# Patient Record
Sex: Male | Born: 1990
Health system: Southern US, Community
[De-identification: ages and names within clinical notes are randomized; demographics above are authoritative.]

## PROBLEM LIST (undated history)

## (undated) DIAGNOSIS — F32A Depression, unspecified: Secondary | ICD-10-CM

## (undated) DIAGNOSIS — F419 Anxiety disorder, unspecified: Secondary | ICD-10-CM

---

## 2012-05-30 ENCOUNTER — Other Ambulatory Visit: Payer: Self-pay | Admitting: Family Medicine

## 2012-05-30 DIAGNOSIS — R079 Chest pain, unspecified: Secondary | ICD-10-CM

## 2012-06-03 ENCOUNTER — Ambulatory Visit
Admission: RE | Admit: 2012-06-03 | Discharge: 2012-06-03 | Disposition: A | Discharge status: Home / Self Care | Payer: PRIVATE HEALTH INSURANCE | Source: Ambulatory Visit | Attending: Family Medicine | Admitting: Family Medicine

## 2012-06-03 DIAGNOSIS — R079 Chest pain, unspecified: Secondary | ICD-10-CM

## 2013-06-02 IMAGING — CT CT CHEST W/O CM
3 of 4 series · 17 of 30 positions shown, 19 images · non-contrast
Comparison: None.

CLINICAL DATA: Chest pain.

CT CHEST WITHOUT CONTRAST
TECHNIQUE: Multidetector CT imaging of the chest was performed
following the standard protocol without IV contrast.

[Series 3: chest w/o · axial · non-contrast · 0.70mm/px · z∈[-288,-98]mm · 4 of 64 slices shown]
[im 13/64  lung]
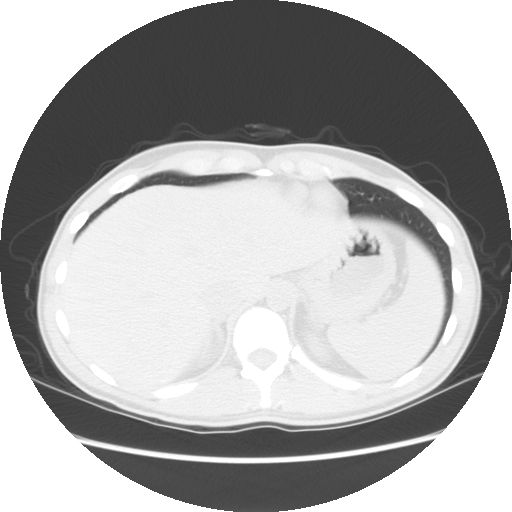
[im 26/64  lung]
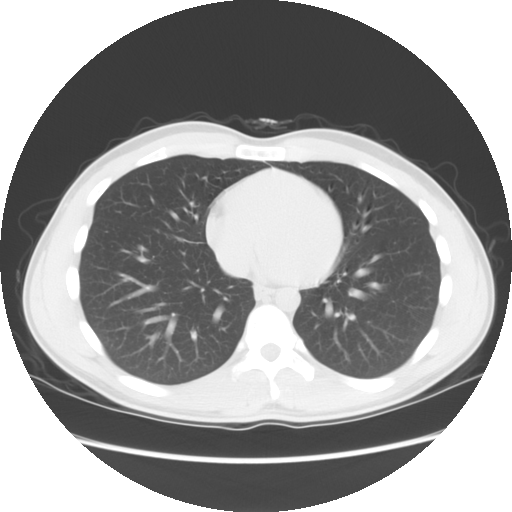
[im 38/64  lung]
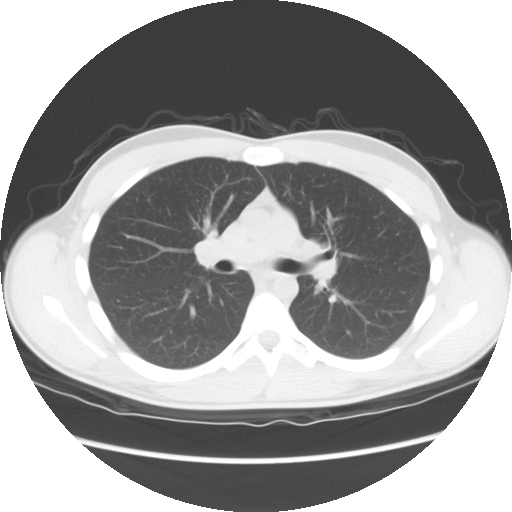
[im 51/64  lung]
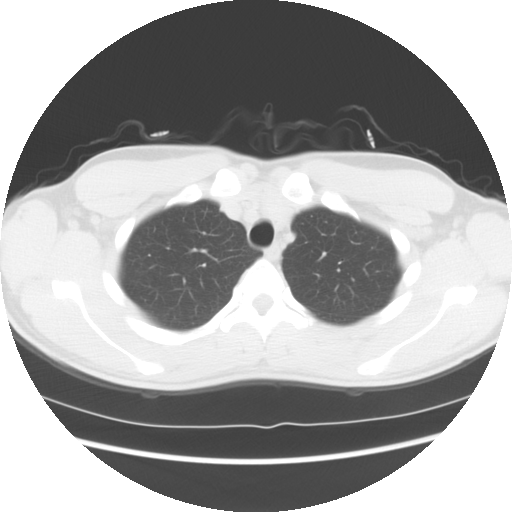

[Series 4: lung windows · axial · 0.70mm/px · z∈[-298,-88]mm · 5 of 64 slices shown, 7 images]
[im 11/64  mediastinal]
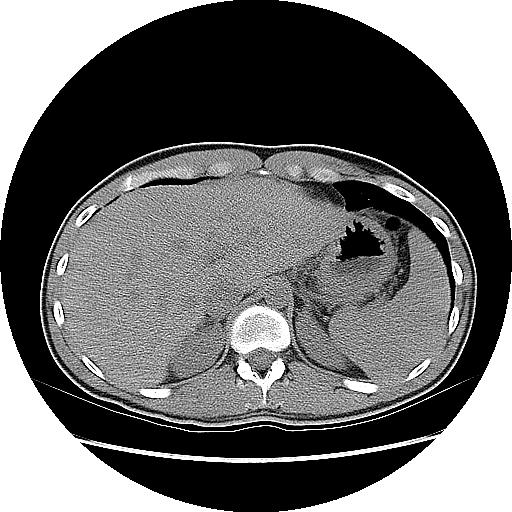
[im 11/64  lung]
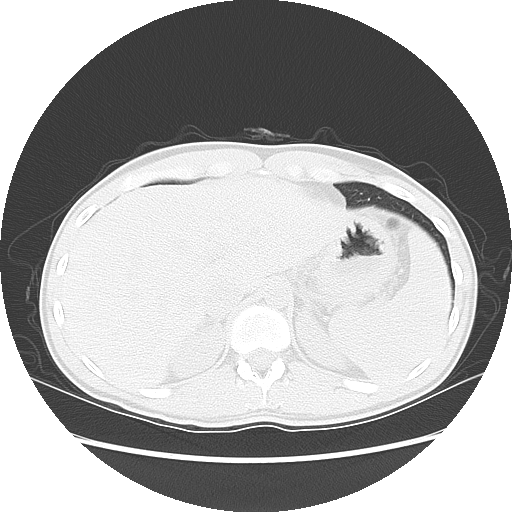
[im 22/64  lung]
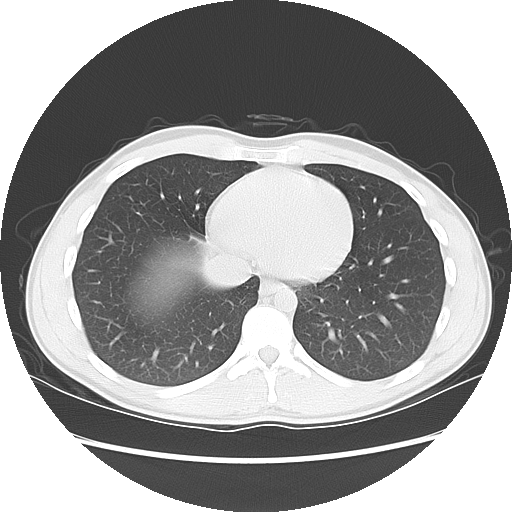
[im 32/64  lung]
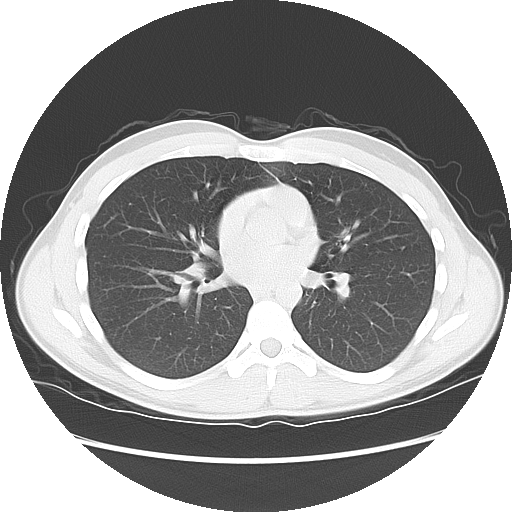
[im 43/64  lung]
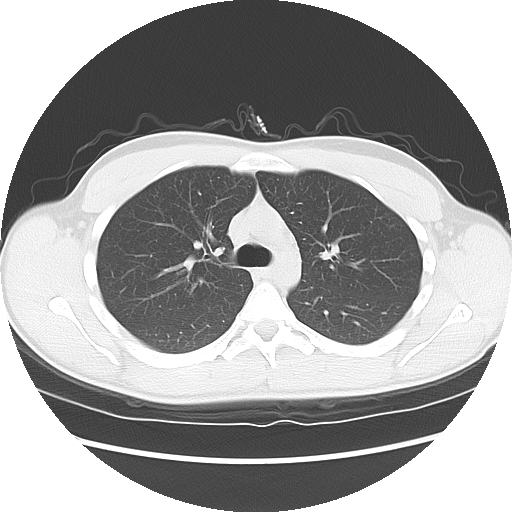
[im 53/64  mediastinal]
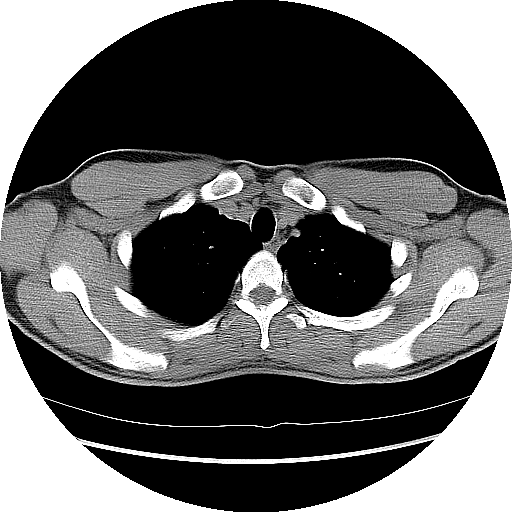
[im 53/64  lung]
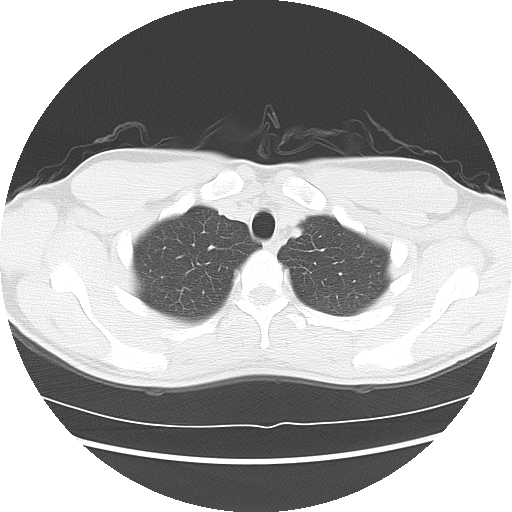

[Series 602: sagittal body · sagittal · 0.70mm/px · 8 of 145 slices shown]
[im 11/145  mediastinal]
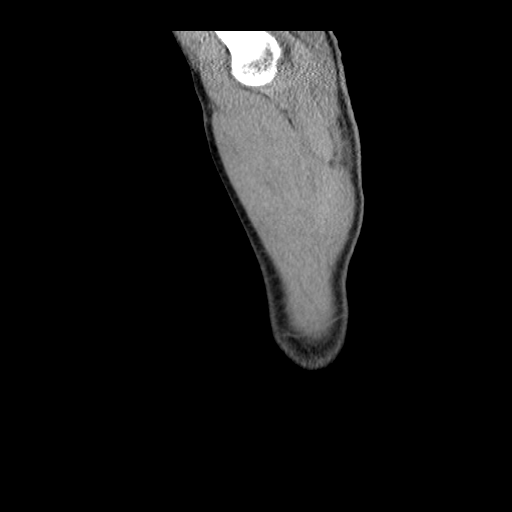
[im 31/145  mediastinal]
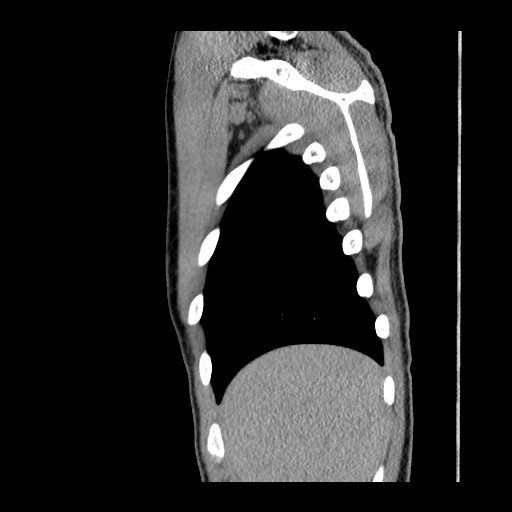
[im 52/145  mediastinal]
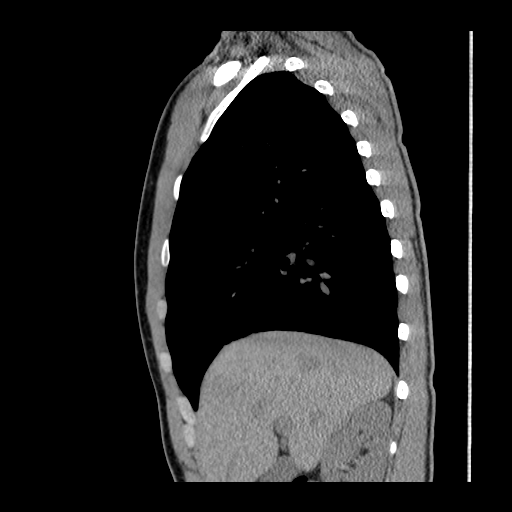
[im 62/145  mediastinal]
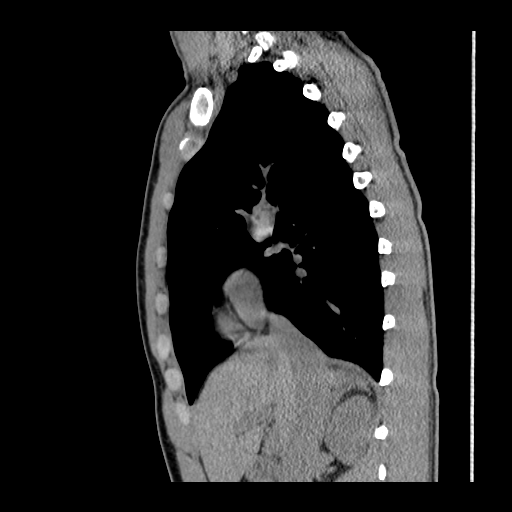
[im 83/145  mediastinal]
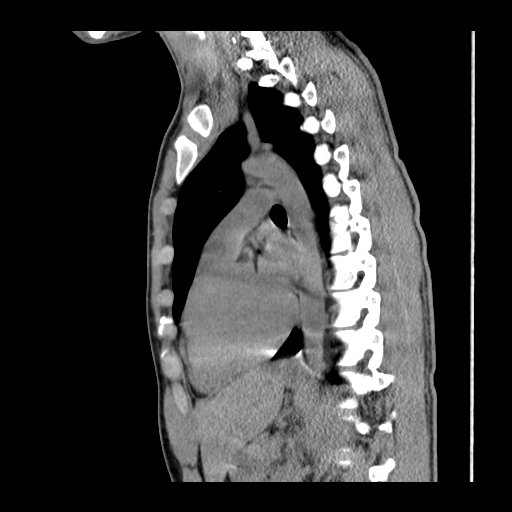
[im 93/145  mediastinal]
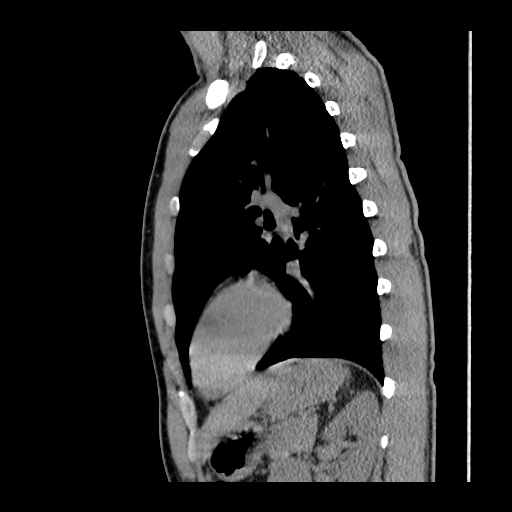
[im 114/145  mediastinal]
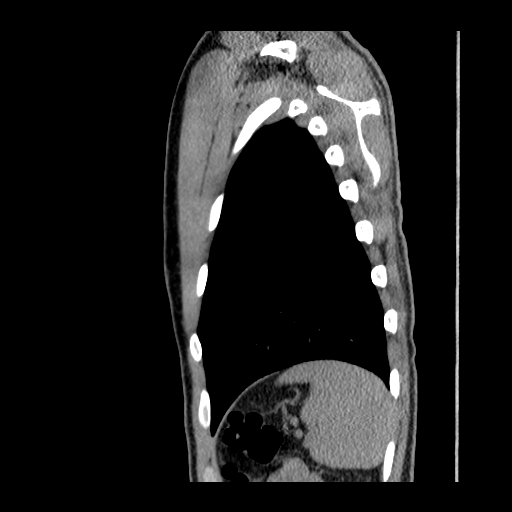
[im 134/145  mediastinal]
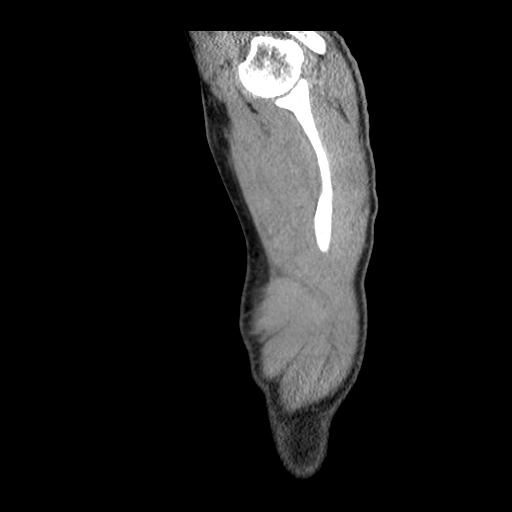

[17 of 30 positions shown; findings below may reference images not displayed]

FINDINGS: Lungs are clear.  No focal airspace opacities or
suspicious nodules.  No effusions. Heart is normal size. Aorta is
normal caliber.  Small scattered mediastinal lymph nodes and
bilateral axillary lymph nodes, none pathologically enlarged.
Visualized thyroid and chest wall soft tissues unremarkable.
Imaging into the upper abdomen shows no acute findings.

No acute bony abnormality.
IMPRESSION: No acute cardiopulmonary disease.

## 2020-08-24 ENCOUNTER — Other Ambulatory Visit: Payer: Self-pay

## 2020-08-24 ENCOUNTER — Ambulatory Visit (HOSPITAL_COMMUNITY)
Admission: EM | Admit: 2020-08-24 | Discharge: 2020-08-24 | Disposition: A | Discharge status: Home / Self Care | Payer: BC Managed Care – PPO | Attending: Psychiatry | Admitting: Psychiatry

## 2020-08-24 ENCOUNTER — Encounter (HOSPITAL_COMMUNITY): Payer: Self-pay | Admitting: Emergency Medicine

## 2020-08-24 DIAGNOSIS — F411 Generalized anxiety disorder: Secondary | ICD-10-CM

## 2020-08-24 DIAGNOSIS — F410 Panic disorder [episodic paroxysmal anxiety] without agoraphobia: Secondary | ICD-10-CM

## 2020-08-24 HISTORY — DX: Anxiety disorder, unspecified: F41.9

## 2020-08-24 HISTORY — DX: Depression, unspecified: F32.A

## 2020-08-24 MED ORDER — HYDROXYZINE PAMOATE 25 MG PO CAPS: 25.0000 mg | ORAL_CAPSULE | Freq: Three times a day (TID) | ORAL | 0 refills | Status: AC | PRN

## 2020-08-24 NOTE — ED Notes (Signed)
Patient A&O x 4, ambulatory. Patient discharged in no acute distress. Patient denied SI/HI, A/VH upon discharge. Patient verbalized understanding of all discharge instructions explained by staff, to include follow up appointments, RX's and safety plan. Pt belongings returned to patient from locker #25 intact. Patient escorted to lobby via staff for transport to destination. Safety maintained.  

## 2020-08-24 NOTE — ED Triage Notes (Signed)
Pt presented to [facility] as a walk-in with complaints of severe panic attacks and depression. Pt stated that every little thing causes him to have full blown panic attack. Pt could not point out  any particular trigger or stressor.Pt did not answer if he is suicidal but responded " I cannot actually  kill myself but I have thought about sleeping and not wake up." Pt denies HI and AVH at this time.

## 2020-08-24 NOTE — ED Provider Notes (Signed)
Behavioral Health Urgent Care Medical Screening Exam  Patient Name: James Heath MRN: [id number] Date of Evaluation: 08/24/20 Chief Complaint:   Diagnosis:  Final diagnoses:  Panic attack  Generalized anxiety disorder    History of Present illness: James Heath is a 30 y.o. male with a history of depression who presents to the [facility] voluntarily for panic attacks and depression. Pt interviewed in conjunction with TTS. Pt states that he came in today because "I've been having panic attacks everyday". He states that this has been occurring for the last 2 years but that over the last 6 months to 1 year they have gotten worse. States that they have been occurring with increasing frequency and severity. Unable to provide specific frequency but states most recent panic attack was earlier today. Panic attacks are at times triggered by certain things, such as coworkers, but other times they are unprovoked. Pt states that he has started to avoid going out with friends and other activities d/t fear that he will have a panic attack. He states that panic attacks are now interfering with his functionality at work and describes becoming irritable and angry with coworkers although has been able to continue working full time. Pt describes experiencing cognitive distortions (specifically catastrophizing, jumping to conclusions) which worsen his anxiety and can contribute to panic attacks. Pt states that he "cant stop thinking about the worst stuff" and worries that people are "mad" at him. Pt states that he is not currently seeing a therapist but did for a couple sessions while in college. He states that he was dx with depression in the past and was prescribed lexapro but that the provider "treated me like I had a drug addiction" which led to termination of the doctor-patient relationship on his end. Pt states that his depression "is seeping back in" but that "its not as bad as the anxiety". Pt denies SI/HI/AVH. He  reports at times experiencing passive SI but denies this currently. Pt states that he smokes marijuana daily and acknowledged that it could be contributing to anxiety and panic attacks. Pt requests medication to assist with anxiety as well as outpatient resources. Discussed r/b/se/ae of vistaril 25 mg TID PRN. Pt agreeable to trial but raised concern about the time of medication onset as his panic attacks come on quickly. Encouraged patient to recognize triggers and take beforehand, utilize coping skills, and to establish care with a therapist and psychiatrist for further medication management. 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, product/process development Ed: did not assess Housing Status: alone History of phys/sexual abuse: did not assess  Substance Use (with emphasis over the last 12 months) Recreational Drugs: marijuana daily Use of Alcohol: occasional, social use  Legal History: Past Charges/Incarcerations: no Pending charges: no  Family Psychiatric History: Sister with anxiety Mother with depression No known hx of attempted or completed suicides   Psychiatric Specialty Exam  Presentation  General Appearance:Appropriate for Environment; Casual  Eye Contact:Good  Speech:Clear and Coherent; Normal Rate  Speech Volume:Normal  Handedness:No data recorded  Mood and Affect  Mood:Anxious  Affect:Appropriate; Congruent; Other (comment) (anxious)   Thought Process  Thought Processes:Coherent; Goal Directed; Linear  Descriptions of Associations:Intact  Orientation:Full (Time, Place and Person)  Thought Content:WDL  Hallucinations:None  Ideas of Reference:None  Suicidal  Thoughts:No  Homicidal Thoughts:No   Sensorium  Memory:Immediate Good; Recent Good; Remote Good  Judgment:Good  Insight:Good   Executive Functions  Concentration:Good  Attention Span:Good  Recall:Good  Fund of Knowledge:Good  Language:Good   Psychomotor Activity  Psychomotor Activity:Normal   Assets  Assets:Communication Skills; Desire for Improvement; Financial Resources/Insurance; Housing; Physical Health; Social Support; Talents/Skills; Vocational/Educational   Sleep  Sleep:Fair  Number of hours: No data recorded  Physical Exam: Physical Exam Constitutional:      Appearance: Normal appearance. He is normal weight.  HENT:     Head: Normocephalic and atraumatic.  Eyes:     Extraocular Movements: Extraocular movements intact.  Pulmonary:     Effort: Pulmonary effort is normal.  Neurological:     Mental Status: He is alert.    Review of Systems  Constitutional: Negative for chills and fever.  Neurological: Negative for speech change and focal weakness.  Psychiatric/Behavioral: Negative for depression and suicidal ideas. The patient is nervous/anxious.    Blood pressure 127/78, pulse 68, temperature 98 F (36.7 C), temperature source Oral, resp. rate 18, height 5\' 10"  (1.778 m), weight 67.1 kg, SpO2 100 %. Body mass index is 21.24 kg/m.  Musculoskeletal: Strength & Muscle Tone: within normal limits Gait & Station: normal Patient leans: N/A   [facility] MSE Discharge Disposition for Follow up and Recommendations: Based on my evaluation the patient does not appear to have an emergency medical condition and can be discharged with resources and follow up care in outpatient services for Medication Management and Individual Therapy  Patient provided with 1 month rx for vistaril 25 mg TID PRN  #90 with no refills   , MD 08/24/2020, 5:02 PM

## 2020-08-24 NOTE — Discharge Instructions (Signed)
Outpatient treatment is recommended:  The following are providers that accept your insurance.  [facility]: [address]  [place], [place] [address] Phone: [phone]  [facility] [address] [place], [place] [address] Phone: [phone]  [facility] [address] [place], [place] [address] Phone: [phone]  [facility] [address] [place], [place] [address] Phone: [phone]

## 2020-08-24 NOTE — BH Assessment (Signed)
Comprehensive Clinical Assessment (CCA) Screening, Triage and Referral Note  08/24/2020 James Heath [id number]   Patient is a  year old male/male with a history of untreated anxiety and panic episodes who presents voluntarily to [facility] for assessment.  Patient reports he has been having panic attacks for the past two years, fairly regularly.  They have worsened over the course of the past 6 months.  He states he has been having significant episodes with new onset symptoms of shaking and trouble breathing; which has scared him.  He reached out to his sister, as she struggles with anxiety and panic attacks as well.  She encouraged patient to begin outpatient treatment to begin therapy and medication management.  Patient states anxiety episodes have become unmanageable and they are beginning to interfere at work.  He has been getting to sleep, but doesn't seem to get restful sleep.  He finds himself more irritable, at times yelling at co-workers and states "that's just not me."  Patient hopes to begin outpatient treatment soon, however he was hoping there would be medication options discussed today.  Patient denies SI, HI and AVH.  He admits to daily THC use to self-medicate, although he recognizes [facility] may worsen anxiety symptoms.  He also drinks 2-3 alcoholic drinks daily, with more on the weekends. Patient has a great support system to include his sister and his friend group.  Outpatient treatment is recommended.  Medication options were discussed with Dr. Bronwen Betters.   Disposition: Per Dr. Bronwen Betters,  patient does not meet criteria for inpatient treatment. He will be provided with a bridge Rx for vistiril until he is able to follow up with outpatient treatment.  Patient was provided with outpatient treatment referral information.      Chief Complaint:  Chief Complaint  Patient presents with  . Anxiety    Pt presented to [facility] as a walk-in with complaints of severe panic attacks  and depression. Pt stated that every little thing causes him to have full blown panic attack. Pt could not point out  any particular trigger or stressor.Pt did not answer if he is suicidal but responded " I cannot actually  kill myself but I have thought about sleeping and not wake up." Pt denies HI and AVH at this time.   . Depression   Visit Diagnosis: Anxiety Disorder Unspecified  Patient Reported Information How did you hear about [place]? Other (Comment) (Phreesia 08/24/2020)   Referral name: [facility] (Phreesia 08/24/2020)   Referral phone number: No data recorded Whom do you see for routine medical problems? Primary Care (Phreesia 08/24/2020)   Practice/Facility Name: [facility] (Phreesia 08/24/2020)   Practice/Facility Phone Number: No data recorded  Name of Contact: Leanor Rubenstein [facility] 08/24/2020)   Contact Number: No data recorded  Contact Fax Number: No data recorded  Prescriber Name: Hendrixx Severin [facility] 08/24/2020)   Prescriber Address (if known): No data recorded What Is the Reason for Your Visit/Call Today? Psych Help (Phreesia 08/24/2020)  How Long Has This Been Causing You Problems? > than 6 months (Phreesia 08/24/2020)  Have You Recently Been in Any Inpatient Treatment (Hospital/Detox/Crisis Center/28-Day Program)? No (Phreesia 08/24/2020)   Name/Location of Program/Hospital:No data recorded  How Long Were You There? No data recorded  When Were You Discharged? No data recorded Have You Ever Received Services From [facility] Before? No (Phreesia 08/24/2020)   Who Do You See at [facility]? No data recorded Have You Recently Had Any Thoughts About  Hurting Yourself? Yes (Phreesia 08/24/2020)   Are You Planning to Commit Suicide/Harm Yourself At This time?  No (Phreesia 08/24/2020)  Have you Recently Had Thoughts About Hurting Someone Karolee Ohs? No (Phreesia 08/24/2020)   Explanation: No data recorded Have You Used Any Alcohol or Drugs in  the Past 24 Hours? Yes (Phreesia 08/24/2020)   How Long Ago Did You Use Drugs or Alcohol?  No data recorded  What Did You Use and How Much? Alcohol. 2-3 Drinks (Phreesia 08/24/2020)  What Do You Feel Would Help You the Most Today? Medication (Phreesia 08/24/2020)  Do You Currently Have a Therapist/Psychiatrist? No (Phreesia 08/24/2020)   Name of Therapist/Psychiatrist: No data recorded  Have You Been Recently Discharged From Any Office Practice or Programs? No (Phreesia 08/24/2020)   Explanation of Discharge From Practice/Program:  No data recorded    CCA Screening Triage Referral Assessment Type of Contact: Face-to-Face   Is this Initial or Reassessment? No data recorded  Date Telepsych consult ordered in [facility]:  No data recorded  Time Telepsych consult ordered in [facility]:  No data recorded Patient Reported Information Reviewed? Yes   Patient Left Without Being Seen? No data recorded  Reason for Not Completing Assessment: No data recorded Collateral Involvement: N/A  Does Patient Have a Court Appointed Legal Guardian? No data recorded  Name and Contact of Legal Guardian:  No data recorded If Minor and Not Living with Parent(s), Who has Custody? No data recorded Is CPS involved or ever been involved? Never  Is APS involved or ever been involved? Never  Patient Determined To Be At Risk for Harm To Self or Others Based on Review of Patient Reported Information or Presenting Complaint? No   Method: No data recorded  Availability of Means: No data recorded  Intent: No data recorded  Notification Required: No data recorded  Additional Information for Danger to Others Potential:  No data recorded  Additional Comments for Danger to Others Potential:  No data recorded  Are There Guns or Other Weapons in Your Home?  No data recorded   Types of Guns/Weapons: No data recorded   Are These Weapons Safely Secured?                              No data recorded   Who Could Verify You Are Able  To Have These Secured:    No data recorded Do You Have any Outstanding Charges, Pending Court Dates, Parole/Probation? No data recorded Contacted To Inform of Risk of Harm To Self or Others: No data recorded Location of Assessment: [facility] Services  Does Patient Present under Involuntary Commitment? No   IVC Papers Initial File Date: No data recorded  [place] of Residence: [place]  Patient Currently Receiving the Following Services: No data recorded  Determination of Need: No data recorded  Options For Referral: No data recorded  Yetta Glassman, [facility]
